# Patient Record
Sex: Female | Born: 2000 | Race: White | Hispanic: No | Marital: Single | Smoking: Never smoker
Health system: Southern US, Community
[De-identification: ages and names within clinical notes are randomized; demographics above are authoritative.]

## PROBLEM LIST (undated history)

## (undated) DIAGNOSIS — F419 Anxiety disorder, unspecified: Secondary | ICD-10-CM

## (undated) HISTORY — DX: Anxiety disorder, unspecified: F41.9

---
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
## 2020-02-01 DIAGNOSIS — J029 Acute pharyngitis, unspecified: Secondary | ICD-10-CM | POA: Diagnosis not present

## 2020-02-01 DIAGNOSIS — J22 Unspecified acute lower respiratory infection: Secondary | ICD-10-CM | POA: Diagnosis not present

## 2020-02-01 DIAGNOSIS — Z03818 Encounter for observation for suspected exposure to other biological agents ruled out: Secondary | ICD-10-CM | POA: Diagnosis not present

## 2020-09-03 DIAGNOSIS — R0600 Dyspnea, unspecified: Secondary | ICD-10-CM | POA: Diagnosis not present

## 2020-09-03 DIAGNOSIS — R059 Cough, unspecified: Secondary | ICD-10-CM | POA: Diagnosis not present

## 2020-09-03 DIAGNOSIS — J309 Allergic rhinitis, unspecified: Secondary | ICD-10-CM | POA: Diagnosis not present

## 2020-09-30 DIAGNOSIS — Z01419 Encounter for gynecological examination (general) (routine) without abnormal findings: Secondary | ICD-10-CM | POA: Diagnosis not present

## 2020-09-30 DIAGNOSIS — Z113 Encounter for screening for infections with a predominantly sexual mode of transmission: Secondary | ICD-10-CM | POA: Diagnosis not present

## 2020-09-30 DIAGNOSIS — Z6820 Body mass index (BMI) 20.0-20.9, adult: Secondary | ICD-10-CM | POA: Diagnosis not present

## 2020-10-11 DIAGNOSIS — Z3202 Encounter for pregnancy test, result negative: Secondary | ICD-10-CM | POA: Diagnosis not present

## 2020-10-11 DIAGNOSIS — Z30017 Encounter for initial prescription of implantable subdermal contraceptive: Secondary | ICD-10-CM | POA: Diagnosis not present

## 2021-03-14 DIAGNOSIS — Z309 Encounter for contraceptive management, unspecified: Secondary | ICD-10-CM | POA: Diagnosis not present

## 2021-03-14 DIAGNOSIS — Z719 Counseling, unspecified: Secondary | ICD-10-CM | POA: Diagnosis not present

## 2021-03-14 DIAGNOSIS — F411 Generalized anxiety disorder: Secondary | ICD-10-CM | POA: Diagnosis not present

## 2021-04-25 ENCOUNTER — Emergency Department (HOSPITAL_BASED_OUTPATIENT_CLINIC_OR_DEPARTMENT_OTHER): Payer: BC Managed Care – PPO

## 2021-04-25 ENCOUNTER — Emergency Department (HOSPITAL_BASED_OUTPATIENT_CLINIC_OR_DEPARTMENT_OTHER)
Admission: EM | Admit: 2021-04-25 | Discharge: 2021-04-25 | Disposition: A | Discharge status: Home / Self Care | Payer: BC Managed Care – PPO | Attending: Emergency Medicine | Admitting: Emergency Medicine

## 2021-04-25 ENCOUNTER — Other Ambulatory Visit: Payer: Self-pay

## 2021-04-25 ENCOUNTER — Encounter (HOSPITAL_BASED_OUTPATIENT_CLINIC_OR_DEPARTMENT_OTHER): Payer: Self-pay

## 2021-04-25 DIAGNOSIS — R1013 Epigastric pain: Secondary | ICD-10-CM

## 2021-04-25 DIAGNOSIS — R109 Unspecified abdominal pain: Secondary | ICD-10-CM | POA: Diagnosis not present

## 2021-04-25 DIAGNOSIS — R1011 Right upper quadrant pain: Secondary | ICD-10-CM

## 2021-04-25 DIAGNOSIS — R110 Nausea: Secondary | ICD-10-CM | POA: Diagnosis not present

## 2021-04-25 DIAGNOSIS — K5900 Constipation, unspecified: Secondary | ICD-10-CM | POA: Diagnosis not present

## 2021-04-25 LAB — CBC
HCT: 39 % (ref 36.0–46.0)
Hemoglobin: 13.6 g/dL (ref 12.0–15.0)
MCH: 30.6 pg (ref 26.0–34.0)
MCHC: 34.9 g/dL (ref 30.0–36.0)
MCV: 87.8 fL (ref 80.0–100.0)
Platelets: 322 10*3/uL (ref 150–400)
RBC: 4.44 MIL/uL (ref 3.87–5.11)
RDW: 12.6 % (ref 11.5–15.5)
WBC: 15 10*3/uL — ABNORMAL HIGH (ref 4.0–10.5)
nRBC: 0 % (ref 0.0–0.2)

## 2021-04-25 LAB — COMPREHENSIVE METABOLIC PANEL
ALT: 11 U/L (ref 0–44)
AST: 16 U/L (ref 15–41)
Albumin: 4.7 g/dL (ref 3.5–5.0)
Alkaline Phosphatase: 60 U/L (ref 38–126)
Anion gap: 8 (ref 5–15)
BUN: 16 mg/dL (ref 6–20)
CO2: 23 mmol/L (ref 22–32)
Calcium: 9.2 mg/dL (ref 8.9–10.3)
Chloride: 104 mmol/L (ref 98–111)
Creatinine, Ser: 0.74 mg/dL (ref 0.44–1.00)
GFR, Estimated: >60 mL/min (ref >60)
Glucose, Bld: 108 mg/dL — ABNORMAL HIGH (ref 70–99)
Potassium: 4.2 mmol/L (ref 3.5–5.1)
Sodium: 135 mmol/L (ref 135–145)
Total Bilirubin: 0.5 mg/dL (ref 0.3–1.2)
Total Protein: 7.6 g/dL (ref 6.5–8.1)

## 2021-04-25 LAB — URINALYSIS, MICROSCOPIC (REFLEX)

## 2021-04-25 LAB — URINALYSIS, ROUTINE W REFLEX MICROSCOPIC
Bilirubin Urine: NEGATIVE
Glucose, UA: NEGATIVE mg/dL
Ketones, ur: NEGATIVE mg/dL
Leukocytes,Ua: NEGATIVE
Nitrite: NEGATIVE
Protein, ur: NEGATIVE mg/dL
Specific Gravity, Urine: 1.025 (ref 1.005–1.030)
pH: 7 (ref 5.0–8.0)

## 2021-04-25 LAB — PREGNANCY, URINE: Preg Test, Ur: NEGATIVE

## 2021-04-25 LAB — LIPASE, BLOOD: Lipase: 26 U/L (ref 11–51)

## 2021-04-25 NOTE — ED Notes (Signed)
Patient discharged to home.  All discharge instructions reviewed.  Patient verbalized understanding via teachback method.  VS WDL.  Respirations even and unlabored.  Ambulatory out of ED.   °

## 2021-04-25 NOTE — Discharge Instructions (Addendum)
I recommend increasing your fluid intake, you can drink up to 50 to 64 ounces of water a day, increase your fiber intake which can do into your diet or through supplementation.  Additionally I recommend that you take MiraLAX up to twice daily for the next several days until you have resolution of your abdominal pain, and a satisfying bowel movement.  If you have worsening abdominal pain, or are unable to have a bowel movement you may want to return for further evaluation.

## 2021-04-25 NOTE — ED Provider Notes (Signed)
[place] EMERGENCY DEPARTMENT Provider Note   CSN: [id number] Arrival date & time: 04/25/21  1720     History  Chief Complaint  Patient presents with   Abdominal Pain    Tiffany Harrison is a 21 y.o. female With around 1 day of on and off severe epigastric, and upper abdominal pain.  Does report some constipation over the last few days when bowel movement around 2 to 3 days ago but was not a significant bowel movement.  Patient denies any diarrhea.  Patient denies any vomiting but does endorse some nausea.  Patient reports that the pain is colicky in nature, has had decreased appetite, but does have worsening pain with eating.  No history of acid reflux or other similar pain.   Abdominal Pain Associated symptoms: constipation       Home Medications Prior to Admission medications   Not on File      Allergies    Sulfa antibiotics    Review of Systems   Review of Systems  Gastrointestinal:  Positive for abdominal pain and constipation.  All other systems reviewed and are negative.  Physical Exam Updated Vital Signs BP 117/75 (BP Location: Right Arm)    Pulse 86    Temp 98.1 F (36.7 C) (Oral)    Resp 16    Ht 5\' 1"  (1.549 m)    Wt 49.4 kg    SpO2 99%    BMI 20.60 kg/m  Physical Exam Vitals and nursing note reviewed.  Constitutional:      General: She is not in acute distress.    Appearance: Normal appearance.  HENT:     Head: Normocephalic and atraumatic.  Eyes:     General: No scleral icterus.       Right eye: No discharge.        Left eye: No discharge.  Cardiovascular:     Rate and Rhythm: Normal rate and regular rhythm.     Heart sounds: No murmur heard.   No friction rub. No gallop.  Pulmonary:     Effort: Pulmonary effort is normal.     Breath sounds: Normal breath sounds.  Abdominal:     General: Bowel sounds are normal.     Palpations: Abdomen is soft.     Comments: She has significant tenderness to palpation in epigastric, right upper  quadrant region.  She has no rebound, rigidity, guarding.  She has negative Murphy sign.  No abdominal distention noted.  Skin:    General: Skin is warm and dry.     Capillary Refill: Capillary refill takes less than 2 seconds.  Neurological:     Mental Status: She is alert and oriented to person, place, and time.  Psychiatric:        Mood and Affect: Mood normal.        Behavior: Behavior normal.    ED Results / Procedures / Treatments   Labs (all labs ordered are listed, but only abnormal results are displayed) Labs Reviewed  COMPREHENSIVE METABOLIC PANEL - Abnormal; Notable for the following components:      Result Value   Glucose, Bld 108 (*)    All other components within normal limits  CBC - Abnormal; Notable for the following components:   WBC 15.0 (*)    All other components within normal limits  URINALYSIS, ROUTINE W REFLEX MICROSCOPIC - Abnormal; Notable for the following components:   Hgb urine dipstick TRACE (*)    All other components within normal limits  URINALYSIS, MICROSCOPIC (REFLEX) - Abnormal; Notable for the following components:   Bacteria, UA FEW (*)    All other components within normal limits  LIPASE, BLOOD  PREGNANCY, URINE    EKG None  Radiology DG Abdomen 1 View  Result Date: 04/25/2021 CLINICAL DATA:  Abdominal pain, nausea EXAM: ABDOMEN - 1 VIEW COMPARISON:  None. FINDINGS: Supine frontal view of the abdomen and pelvis demonstrates an unremarkable bowel gas pattern. Moderate retained stool throughout the colon. No masses or abnormal calcifications. Visualized portions of the lung bases are clear. No acute bony abnormality. IMPRESSION: 1. No bowel obstruction or ileus. 2. Moderate fecal retention consistent with constipation. Electronically Signed   By: Randa Ngo M.D.   On: 04/25/2021 21:07   US Abdomen Limited RUQ (LIVER/GB)  Result Date: 04/25/2021 CLINICAL DATA:  Right upper quadrant pain EXAM: ULTRASOUND ABDOMEN LIMITED RIGHT UPPER  QUADRANT COMPARISON:  None. FINDINGS: Gallbladder: No gallstones or wall thickening visualized. No sonographic Murphy sign noted by sonographer. Common bile duct: Diameter: 3 mm Liver: No focal lesion identified. Within normal limits in parenchymal echogenicity. Portal vein is patent on color Doppler imaging with normal direction of blood flow towards the liver. Other: None. IMPRESSION: 1. Unremarkable right upper quadrant ultrasound. Electronically Signed   By: Randa Ngo M.D.   On: 04/25/2021 22:26    Procedures Procedures    Medications Ordered in ED Medications - No data to display  ED Course/ Medical Decision Making/ A&P                            Medical Decision Making Amount and/or Complexity of Data Reviewed Labs: ordered. Radiology: ordered.   This is an overall well-appearing 21 year old female with no significant past medical history who presents with severe upper abdominal pain that is colicky in nature for 1 day.  No history of intra-abdominal surgery, no history of issues with gallbladder.  Patient denies any diarrhea, she does endorse some constipation.  Patient also denies dysuria, hematuria, vaginal discharge, vaginal bleeding, dyspareunia.  Additional history obtained from mother.  I ordered and reviewed lab work including CMP, CBC, UA, lipase.  These results are significant for urinalysis with few bacteria, trace hemoglobin, without any symptoms of dysuria do not believe that this represents a true urinary tract infection.  Patient has unremarkable CMP, very mildly elevated glucose at 108.  Her pregnancy test is negative.  Her lipase is negative.  She does have a moderate leukocytosis white blood cells of 15.  I ordered and interpreted radiographic imaging including ultrasound of her right upper quadrant which shows no abnormalities of the gallbladder or liver.  Additionally interpreted radiographic imaging of the abdomen shows a bowel gas pattern that is consistent  with constipation.  No evidence of decompression or obstruction noted on plain film radiograph of the abdomen.  Is not in severe pain, afebrile, and overall unremarkable lab work.  Discussed that I do believe that her pain is consistent with constipation.  Encouraged increased fluid intake, increased fiber intake, MiraLAX and will discharge with return precautions at this time.  Patient and mother understand and agree to plan. Final Clinical Impression(s) / ED Diagnoses Final diagnoses:  RUQ pain  Epigastric pain  Constipation, unspecified constipation type    Rx / DC Orders ED Discharge Orders     None         Anselmo Pickler, PA-C 04/26/21 0003    Sherwood Gambler, MD 04/29/21 [phone]

## 2021-04-25 NOTE — ED Triage Notes (Signed)
Pt c/o abd pain, nausea x today-NAD-steady gait

## 2021-09-01 DIAGNOSIS — M545 Low back pain, unspecified: Secondary | ICD-10-CM | POA: Diagnosis not present

## 2021-09-13 DIAGNOSIS — F418 Other specified anxiety disorders: Secondary | ICD-10-CM | POA: Diagnosis not present

## 2022-05-24 DIAGNOSIS — Z113 Encounter for screening for infections with a predominantly sexual mode of transmission: Secondary | ICD-10-CM | POA: Diagnosis not present

## 2022-05-24 DIAGNOSIS — Z0000 Encounter for general adult medical examination without abnormal findings: Secondary | ICD-10-CM | POA: Diagnosis not present

## 2022-05-24 DIAGNOSIS — F411 Generalized anxiety disorder: Secondary | ICD-10-CM | POA: Diagnosis not present

## 2022-05-24 DIAGNOSIS — Z23 Encounter for immunization: Secondary | ICD-10-CM | POA: Diagnosis not present

## 2022-05-24 DIAGNOSIS — Z13220 Encounter for screening for lipoid disorders: Secondary | ICD-10-CM | POA: Diagnosis not present

## 2022-05-24 DIAGNOSIS — R002 Palpitations: Secondary | ICD-10-CM | POA: Diagnosis not present

## 2022-06-05 ENCOUNTER — Encounter: Payer: Self-pay | Admitting: Internal Medicine

## 2022-06-06 ENCOUNTER — Ambulatory Visit: Payer: BC Managed Care – PPO | Admitting: Internal Medicine

## 2022-06-06 ENCOUNTER — Encounter: Payer: Self-pay | Admitting: Internal Medicine

## 2022-06-06 VITALS — BP 120/76 | HR 85 | Ht 62.0 in | Wt 117.0 lb

## 2022-06-06 DIAGNOSIS — R0789 Other chest pain: Secondary | ICD-10-CM | POA: Diagnosis not present

## 2022-06-06 DIAGNOSIS — F419 Anxiety disorder, unspecified: Secondary | ICD-10-CM | POA: Diagnosis not present

## 2022-06-06 DIAGNOSIS — R002 Palpitations: Secondary | ICD-10-CM

## 2022-06-06 NOTE — Progress Notes (Signed)
Primary Physician/Referring:  Pcp, No  Patient ID: Tiffany Harrison, female    DOB: May 26, 2000, 22 y.o.   MRN: [id number]  Chief Complaint  Patient presents with   Palpitations        New Patient (Initial Visit)   HPI:    Tiffany Harrison  is a 22 y.o. female with past medical history significant for anxiety and panic attacks who is here to establish care with cardiology. She has a lot of anxiety but she has frequent chest tightness, palpitations, and irregular heartbeats which really worry her. She thinks something is wrong with her heart and it is causing a great deal of stress. She has been put on Zoloft and given hydroxyzine PRN by her PCP. There is no family history of early heart disease. She has never worn an event monitor before but discussed it with her PCP. Patient denies shortness of breath, diaphoresis, syncope, edema, PND, orthopnea.   Past Medical History:  Diagnosis Date   Anxiety    No past surgical history on file. Family History  Problem Relation Age of Onset   Hypertension Mother    Heart attack Maternal Grandfather    Skin cancer Maternal Grandfather     Social History   Tobacco Use   Smoking status: Never   Smokeless tobacco: Never  Substance Use Topics   Alcohol use: Yes    Comment: 2 beers per week   Marital Status: Single  ROS  Review of Systems  Cardiovascular:  Positive for chest pain, irregular heartbeat and palpitations.   Objective  Blood pressure 120/76, pulse 85, height '5\' 2"'$  (1.575 m), weight 117 lb (53.1 kg). Body mass index is 21.4 kg/m.     06/06/2022   10:39 AM 04/25/2021    9:45 PM 04/25/2021    9:30 PM  Vitals with BMI  Height '5\' 2"'$     Weight 117 lbs    BMI 99991111    Systolic [id number]  Diastolic 76 75 77  Pulse 85 86 77     Physical Exam Vitals reviewed.  HENT:     Head: Normocephalic and atraumatic.  Cardiovascular:     Rate and Rhythm: Normal rate and regular rhythm.     Pulses: Normal pulses.     Heart sounds: No  murmur heard. Pulmonary:     Effort: Pulmonary effort is normal.     Breath sounds: Normal breath sounds.  Abdominal:     General: Bowel sounds are normal.  Musculoskeletal:     Right lower leg: No edema.     Left lower leg: No edema.  Skin:    General: Skin is warm and dry.  Neurological:     Mental Status: She is alert.     Medications and allergies   Allergies  Allergen Reactions   Red Dye Nausea And Vomiting   Sulfa Antibiotics Nausea And Vomiting     Medication list after today's encounter   Current Outpatient Medications:    aspirin-acetaminophen-caffeine (EXCEDRIN MIGRAINE) 250-250-65 MG tablet, Take 2 tablets by mouth every 6 (six) hours as needed for headache or migraine., Disp: , Rfl:    etonogestrel (NEXPLANON) 68 MG IMPL implant, 1 each by Subdermal route once., Disp: , Rfl:    hydrOXYzine (ATARAX) 10 MG tablet, Take 10 mg by mouth 3 (three) times daily as needed., Disp: , Rfl:    Probiotic CHEW, 1 gummy Orally Once a day, Disp: , Rfl:    sertraline (ZOLOFT) 25 MG tablet, Take 1 tablet  by mouth daily., Disp: , Rfl:   Laboratory examination:   Lab Results  Component Value Date   NA 135 04/25/2021   K 4.2 04/25/2021   CO2 23 04/25/2021   GLUCOSE 108 (H) 04/25/2021   BUN 16 04/25/2021   CREATININE 0.74 04/25/2021   CALCIUM 9.2 04/25/2021   GFRNONAA >60 04/25/2021       Latest Ref Rng & Units 04/25/2021    5:53 PM  CMP  Glucose 70 - 99 mg/dL 108   BUN 6 - 20 mg/dL 16   Creatinine 0.44 - 1.00 mg/dL 0.74   Sodium 135 - 145 mmol/L 135   Potassium 3.5 - 5.1 mmol/L 4.2   Chloride 98 - 111 mmol/L 104   CO2 22 - 32 mmol/L 23   Calcium 8.9 - 10.3 mg/dL 9.2   Total Protein 6.5 - 8.1 g/dL 7.6   Total Bilirubin 0.3 - 1.2 mg/dL 0.5   Alkaline Phos 38 - 126 U/L 60   AST 15 - 41 U/L 16   ALT 0 - 44 U/L 11       Latest Ref Rng & Units 04/25/2021    5:53 PM  CBC  WBC 4.0 - 10.5 K/uL 15.0   Hemoglobin 12.0 - 15.0 g/dL 13.6   Hematocrit 36.0 - 46.0 % 39.0    Platelets 150 - 400 K/uL 322     Lipid Panel No results for input(s): "CHOL", "TRIG", "LDLCALC", "VLDL", "HDL", "CHOLHDL", "LDLDIRECT" in the last 8760 hours.  HEMOGLOBIN A1C No results found for: "HGBA1C", "MPG" TSH No results for input(s): "TSH" in the last 8760 hours.  External labs:   Labs 05/24/2022: HGB 13.2 HCT 38.8 PLT 329 GLU 80 BUN 9 Cr 0.68 K+ 3.9 TSH 0.92 Cholesterol 167 TRIG 80 HDL 55 LDL 97  Radiology:    Cardiac Studies:     EKG:   06/06/2022: Sinus Rhythm, normal acxis. No ischemia. Normal EKG  Assessment     ICD-10-CM   1. Palpitations  R00.2 EKG 12-Lead    LONG TERM MONITOR (3-14 DAYS)    2. Anxiety  F41.9     3. Chest tightness  R07.89        Orders Placed This Encounter  Procedures   LONG TERM MONITOR (3-14 DAYS)    Standing Status:   Future    Number of Occurrences:   1    Standing Expiration Date:   06/06/2023    Order Specific Question:   Where should this test be performed?    Answer:   [facility]    Order Specific Question:   Does the patient have an implanted cardiac device?    Answer:   No    Order Specific Question:   Prescribed days of wear    Answer:   90    Order Specific Question:   Type of enrollment    Answer:   Clinic Enrollment    Order Specific Question:   Release to patient    Answer:   Immediate   EKG 12-Lead    No orders of the defined types were placed in this encounter.   There are no discontinued medications.   Recommendations:   Tiffany Harrison is a 22 y.o.  female with palpitations   Anxiety Patient has been started on medication by her PCP and ob/gyn Currently on Zoloft and hydroxyzine PRN for panic attacks  Chest tightness Palpitations Patient sounds like she is having panic attacks however, she also has racing heartbeat/chest tightness that scare  her Will obtain event monitor to further assess these rapid heart rates Patient has never worn an event monitor before No family history of  early heart disease Follow-up in 1-2 months or sooner if needed    Floydene Flock, DO, [facility]  06/06/2022, 12:21 PM Office: [phone] Pager: [phone]

## 2022-06-11 ENCOUNTER — Ambulatory Visit: Payer: BC Managed Care – PPO

## 2022-06-11 DIAGNOSIS — R002 Palpitations: Secondary | ICD-10-CM

## 2022-06-20 DIAGNOSIS — F411 Generalized anxiety disorder: Secondary | ICD-10-CM | POA: Diagnosis not present

## 2022-06-20 DIAGNOSIS — R002 Palpitations: Secondary | ICD-10-CM | POA: Diagnosis not present

## 2022-07-18 ENCOUNTER — Ambulatory Visit: Payer: Self-pay | Admitting: Internal Medicine

## 2022-07-18 NOTE — Progress Notes (Signed)
No show

## 2022-11-21 DIAGNOSIS — R102 Pelvic and perineal pain: Secondary | ICD-10-CM | POA: Diagnosis not present

## 2022-11-21 DIAGNOSIS — Z3202 Encounter for pregnancy test, result negative: Secondary | ICD-10-CM | POA: Diagnosis not present

## 2022-11-21 DIAGNOSIS — Z113 Encounter for screening for infections with a predominantly sexual mode of transmission: Secondary | ICD-10-CM | POA: Diagnosis not present

## 2022-12-17 DIAGNOSIS — Z113 Encounter for screening for infections with a predominantly sexual mode of transmission: Secondary | ICD-10-CM | POA: Diagnosis not present

## 2022-12-17 DIAGNOSIS — Z124 Encounter for screening for malignant neoplasm of cervix: Secondary | ICD-10-CM | POA: Diagnosis not present

## 2022-12-17 DIAGNOSIS — Z01419 Encounter for gynecological examination (general) (routine) without abnormal findings: Secondary | ICD-10-CM | POA: Diagnosis not present

## 2022-12-17 DIAGNOSIS — Z6820 Body mass index (BMI) 20.0-20.9, adult: Secondary | ICD-10-CM | POA: Diagnosis not present

## 2023-01-09 DIAGNOSIS — Z6821 Body mass index (BMI) 21.0-21.9, adult: Secondary | ICD-10-CM | POA: Diagnosis not present

## 2023-01-09 DIAGNOSIS — J029 Acute pharyngitis, unspecified: Secondary | ICD-10-CM | POA: Diagnosis not present

## 2023-01-09 DIAGNOSIS — R059 Cough, unspecified: Secondary | ICD-10-CM | POA: Diagnosis not present

## 2023-06-25 ENCOUNTER — Ambulatory Visit (INDEPENDENT_AMBULATORY_CARE_PROVIDER_SITE_OTHER)

## 2023-06-25 ENCOUNTER — Ambulatory Visit: Admission: EM | Admit: 2023-06-25 | Discharge: 2023-06-25 | Disposition: A | Discharge status: Home / Self Care

## 2023-06-25 DIAGNOSIS — J3489 Other specified disorders of nose and nasal sinuses: Secondary | ICD-10-CM

## 2023-06-25 DIAGNOSIS — R519 Headache, unspecified: Secondary | ICD-10-CM | POA: Diagnosis not present

## 2023-06-25 DIAGNOSIS — Z041 Encounter for examination and observation following transport accident: Secondary | ICD-10-CM | POA: Diagnosis not present

## 2023-06-25 DIAGNOSIS — S0121XA Laceration without foreign body of nose, initial encounter: Secondary | ICD-10-CM | POA: Diagnosis not present

## 2023-06-25 MED ORDER — TETANUS-DIPHTH-ACELL PERTUSSIS 5-2.5-18.5 LF-MCG/0.5 IM SUSY
0.5000 mL | PREFILLED_SYRINGE | Freq: Once | INTRAMUSCULAR | Status: AC
  Administered 2023-06-25: 0.5 mL via INTRAMUSCULAR
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
## 2023-09-02 DIAGNOSIS — N939 Abnormal uterine and vaginal bleeding, unspecified: Secondary | ICD-10-CM | POA: Diagnosis not present

## 2023-09-02 DIAGNOSIS — Z113 Encounter for screening for infections with a predominantly sexual mode of transmission: Secondary | ICD-10-CM | POA: Diagnosis not present

## 2023-09-02 DIAGNOSIS — D509 Iron deficiency anemia, unspecified: Secondary | ICD-10-CM | POA: Diagnosis not present

## 2023-09-02 DIAGNOSIS — N760 Acute vaginitis: Secondary | ICD-10-CM | POA: Diagnosis not present

## 2023-09-06 DIAGNOSIS — Z3046 Encounter for surveillance of implantable subdermal contraceptive: Secondary | ICD-10-CM | POA: Diagnosis not present

## 2023-09-15 IMAGING — DX DG ABDOMEN 1V
1 series · 1 of 1 positions shown · non-contrast
Comparison: None.

CLINICAL DATA: Abdominal pain, nausea

EXAM:
ABDOMEN - 1 VIEW

[abdomen kub]
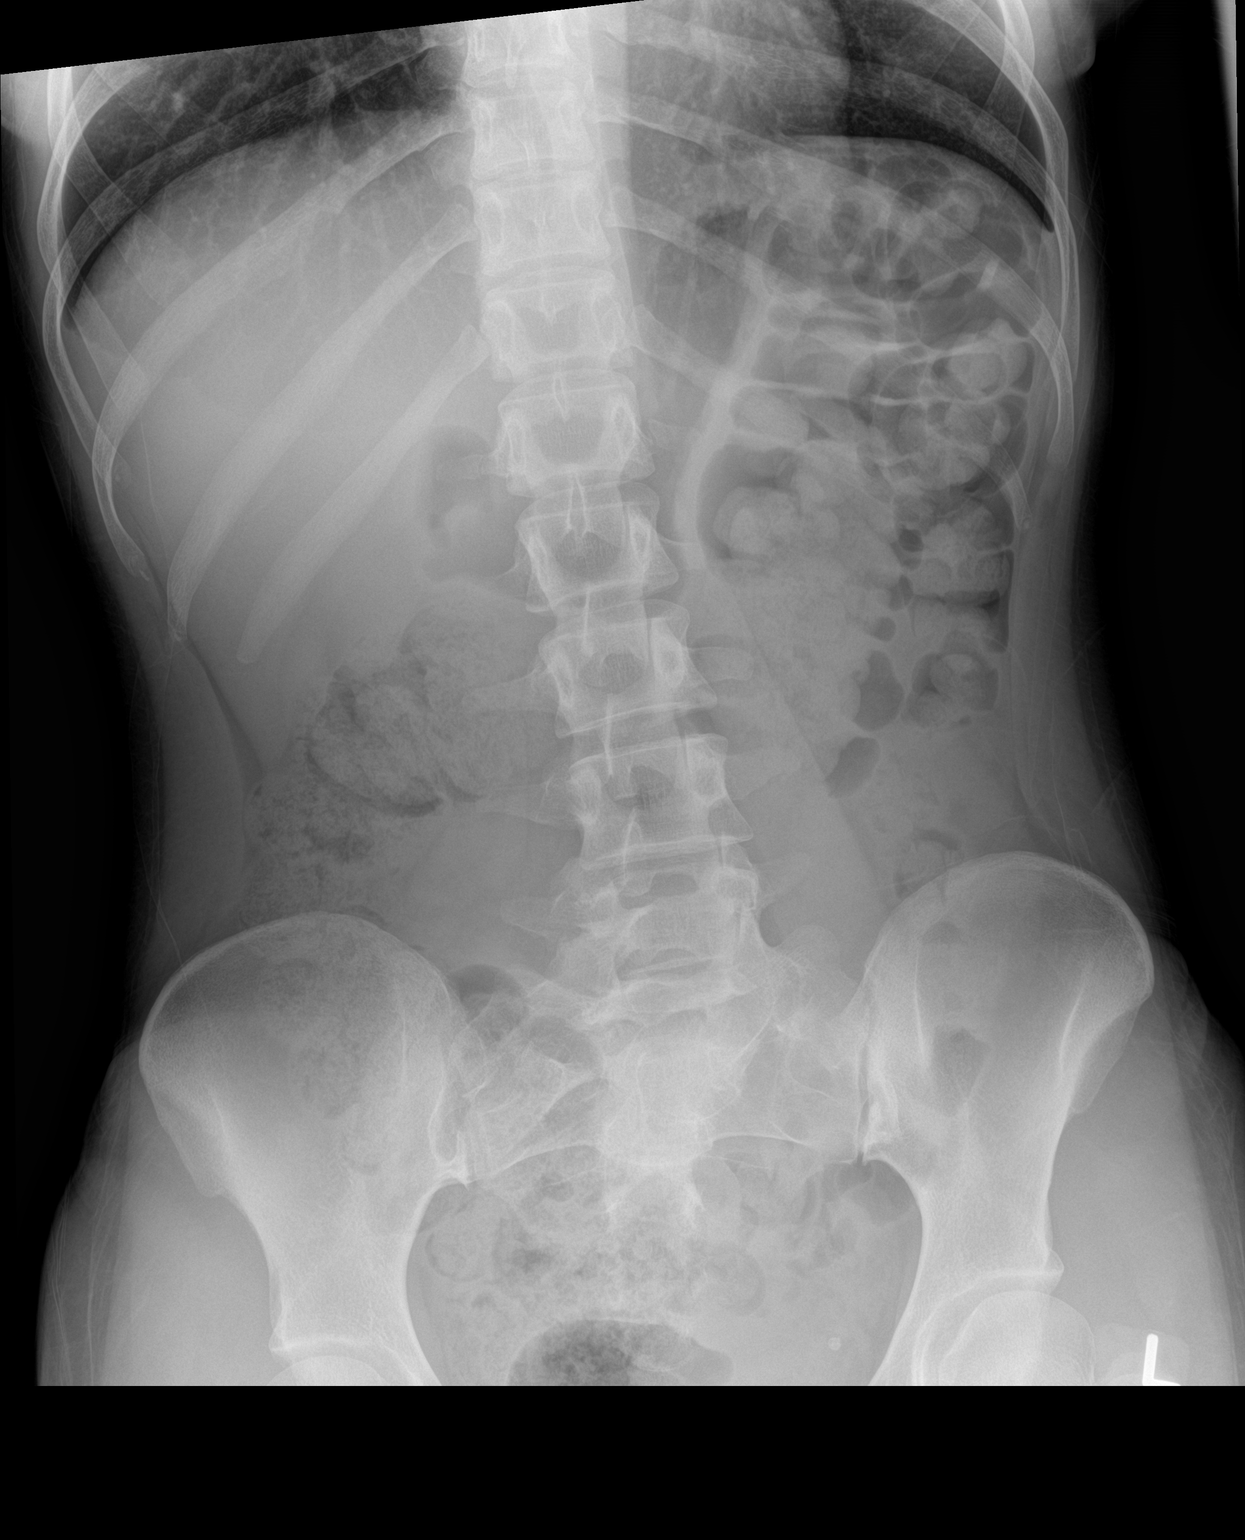

[1 of 1 positions shown; findings below may reference images not displayed]

FINDINGS: Supine frontal view of the abdomen and pelvis demonstrates an
unremarkable bowel gas pattern. Moderate retained stool throughout
the colon. No masses or abnormal calcifications. Visualized portions
of the lung bases are clear. No acute bony abnormality.
IMPRESSION: 1. No bowel obstruction or ileus.
2. Moderate fecal retention consistent with constipation.
